# Patient Record
Sex: Male | Born: 2000 | Hispanic: Yes | Marital: Single | Smoking: Never smoker
Health system: Southern US, Community
[De-identification: ages and names within clinical notes are randomized; demographics above are authoritative.]

---
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
## 2023-03-24 ENCOUNTER — Emergency Department (HOSPITAL_COMMUNITY)
Admission: EM | Admit: 2023-03-24 | Discharge: 2023-03-25 | Disposition: A | Discharge status: Home / Self Care | Payer: Self-pay | Attending: Emergency Medicine | Admitting: Emergency Medicine

## 2023-03-24 ENCOUNTER — Other Ambulatory Visit: Payer: Self-pay

## 2023-03-24 ENCOUNTER — Emergency Department (HOSPITAL_COMMUNITY): Payer: Self-pay

## 2023-03-24 DIAGNOSIS — Z0000 Encounter for general adult medical examination without abnormal findings: Secondary | ICD-10-CM | POA: Insufficient documentation

## 2023-03-24 NOTE — ED Triage Notes (Signed)
Pt arrives in police custody from jail, officer states pt was scanned at the jail and was told it appeared that pt had foreign body in rectum. Pt denies putting anything in rectum.

## 2023-03-25 NOTE — Discharge Instructions (Signed)
Your exam and x-ray showed no evidence of any foreign body in your rectum.
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
## 2023-05-12 ENCOUNTER — Emergency Department (HOSPITAL_COMMUNITY): Payer: BC Managed Care – PPO

## 2023-05-12 ENCOUNTER — Other Ambulatory Visit: Payer: Self-pay

## 2023-05-12 ENCOUNTER — Encounter (HOSPITAL_COMMUNITY): Payer: Self-pay

## 2023-05-12 ENCOUNTER — Emergency Department (HOSPITAL_COMMUNITY)
Admission: EM | Admit: 2023-05-12 | Discharge: 2023-05-12 | Disposition: A | Discharge status: Home / Self Care | Payer: BC Managed Care – PPO | Attending: Emergency Medicine | Admitting: Emergency Medicine

## 2023-05-12 DIAGNOSIS — S0990XA Unspecified injury of head, initial encounter: Secondary | ICD-10-CM | POA: Diagnosis present

## 2023-05-12 DIAGNOSIS — M545 Low back pain, unspecified: Secondary | ICD-10-CM | POA: Diagnosis not present

## 2023-05-12 DIAGNOSIS — S90512A Abrasion, left ankle, initial encounter: Secondary | ICD-10-CM | POA: Diagnosis not present

## 2023-05-12 DIAGNOSIS — S80812A Abrasion, left lower leg, initial encounter: Secondary | ICD-10-CM | POA: Diagnosis not present

## 2023-05-12 DIAGNOSIS — T148XXA Other injury of unspecified body region, initial encounter: Secondary | ICD-10-CM

## 2023-05-12 DIAGNOSIS — S40211A Abrasion of right shoulder, initial encounter: Secondary | ICD-10-CM | POA: Diagnosis not present

## 2023-05-12 DIAGNOSIS — S0081XA Abrasion of other part of head, initial encounter: Secondary | ICD-10-CM | POA: Diagnosis not present

## 2023-05-12 LAB — COMPREHENSIVE METABOLIC PANEL
ALT: 16 U/L (ref 0–44)
AST: 22 U/L (ref 15–41)
Albumin: 4.7 g/dL (ref 3.5–5.0)
Alkaline Phosphatase: 73 U/L (ref 38–126)
Anion gap: 9 (ref 5–15)
BUN: 14 mg/dL (ref 6–20)
CO2: 25 mmol/L (ref 22–32)
Calcium: 9.5 mg/dL (ref 8.9–10.3)
Chloride: 103 mmol/L (ref 98–111)
Creatinine, Ser: 1.12 mg/dL (ref 0.61–1.24)
GFR, Estimated: >60 mL/min (ref >60)
Glucose, Bld: 111 mg/dL — ABNORMAL HIGH (ref 70–99)
Potassium: 3.8 mmol/L (ref 3.5–5.1)
Sodium: 137 mmol/L (ref 135–145)
Total Bilirubin: 1.2 mg/dL (ref 0.3–1.2)
Total Protein: 7.7 g/dL (ref 6.5–8.1)

## 2023-05-12 LAB — I-STAT CHEM 8, ED
BUN: 16 mg/dL (ref 6–20)
Calcium, Ion: 1.21 mmol/L (ref 1.15–1.40)
Chloride: 104 mmol/L (ref 98–111)
Creatinine, Ser: 1.1 mg/dL (ref 0.61–1.24)
Glucose, Bld: 109 mg/dL — ABNORMAL HIGH (ref 70–99)
HCT: 45 % (ref 39.0–52.0)
Hemoglobin: 15.3 g/dL (ref 13.0–17.0)
Potassium: 3.9 mmol/L (ref 3.5–5.1)
Sodium: 141 mmol/L (ref 135–145)
TCO2: 25 mmol/L (ref 22–32)

## 2023-05-12 LAB — CBC
HCT: 42.7 % (ref 39.0–52.0)
Hemoglobin: 14.8 g/dL (ref 13.0–17.0)
MCH: 31.3 pg (ref 26.0–34.0)
MCHC: 34.7 g/dL (ref 30.0–36.0)
MCV: 90.3 fL (ref 80.0–100.0)
Platelets: 225 10*3/uL (ref 150–400)
RBC: 4.73 MIL/uL (ref 4.22–5.81)
RDW: 12.8 % (ref 11.5–15.5)
WBC: 9.3 10*3/uL (ref 4.0–10.5)
nRBC: 0 % (ref 0.0–0.2)

## 2023-05-12 LAB — PROTIME-INR
INR: 1 (ref 0.8–1.2)
Prothrombin Time: 13.5 seconds (ref 11.4–15.2)

## 2023-05-12 LAB — SAMPLE TO BLOOD BANK

## 2023-05-12 LAB — LACTIC ACID, PLASMA: Lactic Acid, Venous: 2 mmol/L (ref 0.5–1.9)

## 2023-05-12 MED ORDER — HYDROMORPHONE HCL 1 MG/ML IJ SOLN
0.5000 mg | Freq: Once | INTRAMUSCULAR | Status: AC
  Administered 2023-05-12: 0.5 mg via INTRAVENOUS
  Filled 2023-05-12: qty 1

## 2023-05-12 MED ORDER — SODIUM CHLORIDE 0.9 % IV BOLUS
1000.0000 mL | Freq: Once | INTRAVENOUS | Status: AC
  Administered 2023-05-12: 1000 mL via INTRAVENOUS

## 2023-05-12 MED ORDER — HYDROCODONE-ACETAMINOPHEN 5-325 MG PO TABS: 1.0000 | ORAL_TABLET | Freq: Four times a day (QID) | ORAL | 0 refills | Status: AC | PRN

## 2023-05-12 MED ORDER — IOHEXOL 350 MG/ML SOLN
75.0000 mL | Freq: Once | INTRAVENOUS | Status: AC | PRN
  Administered 2023-05-12: 75 mL via INTRAVENOUS

## 2023-05-12 MED ORDER — KETOROLAC TROMETHAMINE 15 MG/ML IJ SOLN
15.0000 mg | Freq: Once | INTRAMUSCULAR | Status: AC
  Administered 2023-05-12: 15 mg via INTRAVENOUS
  Filled 2023-05-12: qty 1

## 2023-05-12 MED ORDER — IBUPROFEN 600 MG PO TABS: 600.0000 mg | ORAL_TABLET | Freq: Four times a day (QID) | ORAL | 0 refills | Status: AC | PRN

## 2023-05-12 NOTE — ED Notes (Signed)
Patient currently at X-ray .  

## 2023-05-12 NOTE — ED Triage Notes (Signed)
Pt arrived via Pov c/o bilateral hand pain 10/10
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
